# Patient Record
Sex: Female | Born: 1985 | Race: White | Hispanic: No | Marital: Single | Smoking: Current every day smoker
Health system: Southern US, Community
[De-identification: ages and names within clinical notes are randomized; demographics above are authoritative.]

## PROBLEM LIST (undated history)

## (undated) DIAGNOSIS — F32A Depression, unspecified: Secondary | ICD-10-CM

## (undated) DIAGNOSIS — F1010 Alcohol abuse, uncomplicated: Secondary | ICD-10-CM

## (undated) DIAGNOSIS — F329 Major depressive disorder, single episode, unspecified: Secondary | ICD-10-CM

## (undated) DIAGNOSIS — F319 Bipolar disorder, unspecified: Secondary | ICD-10-CM

---
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
## 2005-09-23 ENCOUNTER — Inpatient Hospital Stay (HOSPITAL_COMMUNITY): Admission: RE | Admit: 2005-09-23 | Discharge: 2005-10-01 | Payer: Self-pay | Admitting: Psychiatry

## 2005-09-24 ENCOUNTER — Ambulatory Visit: Payer: Self-pay | Admitting: Psychiatry

## 2006-09-30 ENCOUNTER — Emergency Department (HOSPITAL_COMMUNITY): Admission: EM | Admit: 2006-09-30 | Discharge: 2006-09-30 | Payer: Self-pay | Admitting: Orthopaedic Surgery

## 2011-10-11 ENCOUNTER — Encounter: Payer: Self-pay | Admitting: Physical Medicine and Rehabilitation

## 2011-10-11 ENCOUNTER — Emergency Department (HOSPITAL_COMMUNITY)
Admission: EM | Admit: 2011-10-11 | Discharge: 2011-10-12 | Disposition: A | Discharge status: Home / Self Care | Payer: BC Managed Care – PPO | Attending: Emergency Medicine | Admitting: Emergency Medicine

## 2011-10-11 DIAGNOSIS — W19XXXA Unspecified fall, initial encounter: Secondary | ICD-10-CM | POA: Insufficient documentation

## 2011-10-11 DIAGNOSIS — F1010 Alcohol abuse, uncomplicated: Secondary | ICD-10-CM | POA: Insufficient documentation

## 2011-10-11 DIAGNOSIS — M7989 Other specified soft tissue disorders: Secondary | ICD-10-CM | POA: Insufficient documentation

## 2011-10-11 DIAGNOSIS — F17200 Nicotine dependence, unspecified, uncomplicated: Secondary | ICD-10-CM | POA: Insufficient documentation

## 2011-10-11 DIAGNOSIS — Z79899 Other long term (current) drug therapy: Secondary | ICD-10-CM | POA: Insufficient documentation

## 2011-10-11 DIAGNOSIS — F3130 Bipolar disorder, current episode depressed, mild or moderate severity, unspecified: Secondary | ICD-10-CM | POA: Insufficient documentation

## 2011-10-11 DIAGNOSIS — S6990XA Unspecified injury of unspecified wrist, hand and finger(s), initial encounter: Secondary | ICD-10-CM | POA: Insufficient documentation

## 2011-10-11 HISTORY — DX: Alcohol abuse, uncomplicated: F10.10

## 2011-10-11 HISTORY — DX: Depression, unspecified: F32.A

## 2011-10-11 HISTORY — DX: Bipolar disorder, unspecified: F31.9

## 2011-10-11 HISTORY — DX: Major depressive disorder, single episode, unspecified: F32.9

## 2011-10-11 LAB — RAPID URINE DRUG SCREEN, HOSP PERFORMED
Amphetamines: NOT DETECTED
Cocaine: NOT DETECTED
Opiates: NOT DETECTED
Tetrahydrocannabinol: POSITIVE — AB

## 2011-10-11 LAB — COMPREHENSIVE METABOLIC PANEL
Albumin: 3.4 g/dL — ABNORMAL LOW (ref 3.5–5.2)
CO2: 28 mEq/L (ref 19–32)
Calcium: 9 mg/dL (ref 8.4–10.5)
Chloride: 103 mEq/L (ref 96–112)
Creatinine, Ser: 0.62 mg/dL (ref 0.50–1.10)
GFR calc Af Amer: >90 mL/min (ref >90)
Glucose, Bld: 175 mg/dL — ABNORMAL HIGH (ref 70–99)
Total Bilirubin: 0.2 mg/dL — ABNORMAL LOW (ref 0.3–1.2)
Total Protein: 7 g/dL (ref 6.0–8.3)

## 2011-10-11 LAB — CBC
MCHC: 32.6 g/dL (ref 30.0–36.0)
RBC: 4.19 MIL/uL (ref 3.87–5.11)
WBC: 9.3 10*3/uL (ref 4.0–10.5)

## 2011-10-11 NOTE — ED Notes (Signed)
Pt reports that she had a shot of jack daniels for breakfast.  Reports that she drinks heavily on the weekends.  Seeking help to stop drinking-reports getting into 3 programs before.  Denies being an alcoholic.  Pt also reports that she has been depressed.    Pt also requesting to be evaluated for her (R) hand swelling.   Pt reports falling off the roof this am while hanging christmas lights.  Denies LOC.  Pt is noted to have abrasions on (R) arm and a bruise on (L) leg.  Skin warm, dry and intact.  Neuro intact.

## 2011-10-11 NOTE — ED Notes (Signed)
Pt presents to department for evaluation of R arm pain and injury. States she was putting up christmas lights this morning when she slipped and fell and landed on R hand. Swelling noted to R hand and forearm. Also states she wants help with her depression and drinking problem. States she has been drinking too much. Last drink morning of 12/7. Denies SI/HI. Pt states she wants to get help. 5/10 pain at the time. Ambulatory to triage.

## 2011-10-12 ENCOUNTER — Encounter (HOSPITAL_COMMUNITY): Payer: Self-pay | Admitting: Emergency Medicine

## 2011-10-12 NOTE — ED Provider Notes (Signed)
History     CSN: [id number] Arrival date & time: 10/11/2011  7:51 PM   First MD Initiated Contact with Patient 10/11/11 2317      Chief Complaint  Patient presents with  . Depression  . Alcohol Problem  . Arm Injury    HPI  History provided by the patient. Patient presents with requests for help with alcohol rehabilitation and detox. Patient states that she recently was cited for DUI and has option of participating in alcohol detox program or spending 7 days in jail. Currently patient states she is tired of waiting in the emergency room. Patient denies feeling depressed or having thoughts of suicide or homicidal ideation. Patient's last drink was earlier today. Patient has history of drinking at least a sixpack of beer a day. She denies any other symptoms at this time. Patient denies any other significant past medical history. Patient also reports having some injury to her right hand after a fall. She has had some swelling to the hand but denies pain at this time or decreased range of motion. Patient states she is not worried about her hand.   Past Medical History  Diagnosis Date  . Depression   . Alcohol abuse   . Bipolar 1 disorder     History reviewed. No pertinent past surgical history.  History reviewed. No pertinent family history.  History  Substance Use Topics  . Smoking status: Current Everyday Smoker    Types: Cigarettes  . Smokeless tobacco: Not on file  . Alcohol Use: 3.6 oz/week    6 Cans of beer per week    OB History    Grav Para Term Preterm Abortions TAB SAB Ect Mult Living                  Review of Systems  All other systems reviewed and are negative.    Allergies  Review of patient's allergies indicates no known allergies.  Home Medications   Current Outpatient Rx  Name Route Sig Dispense Refill  . ALPRAZOLAM 0.5 MG PO TABS Oral Take 0.5 mg by mouth 2 (two) times daily.      Marland Kitchen ZIPRASIDONE HCL 80 MG PO CAPS Oral Take 80 mg by mouth 2 (two)  times daily with a meal.        BP 131/79  Pulse 105  Temp(Src) 98 F (36.7 C) (Oral)  Resp 17  SpO2 98%  Physical Exam  Nursing note and vitals reviewed. Constitutional: She is oriented to person, place, and time. She appears well-developed and well-nourished. No distress.  Cardiovascular: Normal rate, regular rhythm and normal heart sounds.   Pulmonary/Chest: Effort normal and breath sounds normal.  Abdominal: Soft. There is no tenderness.  Musculoskeletal:       Diffuse swelling over dorsal rt hand.  Full ROM of rt wirst, fingers and hand.  Normal strength in fingers.  Normal distal sensations and cap refill.  Normal radial pulse.  No obvious deformity.  Neurological: She is alert and oriented to person, place, and time.  Skin: Skin is warm.  Psychiatric: She has a normal mood and affect. Her behavior is normal.    ED Course  Procedures (including critical care time)  Labs Reviewed  URINE RAPID DRUG SCREEN (HOSP PERFORMED) - Abnormal; Notable for the following:    Benzodiazepines POSITIVE (*)    Tetrahydrocannabinol POSITIVE (*)    All other components within normal limits  COMPREHENSIVE METABOLIC PANEL - Abnormal; Notable for the following:    Glucose, Bld  175 (*)    Albumin 3.4 (*)    AST 116 (*)    ALT 240 (*)    Total Bilirubin 0.2 (*)    All other components within normal limits  CBC   Results for orders placed during the hospital encounter of 10/11/11  URINE RAPID DRUG SCREEN (HOSP PERFORMED)      Component Value Range   Opiates NONE DETECTED  NONE DETECTED    Cocaine NONE DETECTED  NONE DETECTED    Benzodiazepines POSITIVE (*) NONE DETECTED    Amphetamines NONE DETECTED  NONE DETECTED    Tetrahydrocannabinol POSITIVE (*) NONE DETECTED    Barbiturates NONE DETECTED  NONE DETECTED   CBC      Component Value Range   WBC 9.3  4.0 - 10.5 (K/uL)   RBC 4.19  3.87 - 5.11 (MIL/uL)   Hemoglobin 12.6  12.0 - 15.0 (g/dL)   HCT 16.1  09.6 - 04.5 (%)   MCV 92.4   78.0 - 100.0 (fL)   MCH 30.1  26.0 - 34.0 (pg)   MCHC 32.6  30.0 - 36.0 (g/dL)   RDW 40.9  81.1 - 91.4 (%)   Platelets 222  150 - 400 (K/uL)  COMPREHENSIVE METABOLIC PANEL      Component Value Range   Sodium 139  135 - 145 (mEq/L)   Potassium 3.7  3.5 - 5.1 (mEq/L)   Chloride 103  96 - 112 (mEq/L)   CO2 28  19 - 32 (mEq/L)   Glucose, Bld 175 (*) 70 - 99 (mg/dL)   BUN 11  6 - 23 (mg/dL)   Creatinine, Ser 7.82  0.50 - 1.10 (mg/dL)   Calcium 9.0  8.4 - 95.6 (mg/dL)   Total Protein 7.0  6.0 - 8.3 (g/dL)   Albumin 3.4 (*) 3.5 - 5.2 (g/dL)   AST 213 (*) 0 - 37 (U/L)   ALT 240 (*) 0 - 35 (U/L)   Alkaline Phosphatase 72  39 - 117 (U/L)   Total Bilirubin 0.2 (*) 0.3 - 1.2 (mg/dL)   GFR calc non Af Amer >90  >90 (mL/min)   GFR calc Af Amer >90  >90 (mL/min)      1. Alcohol abuse     MDM   12:00 AM she seen and evaluated. Patient in no acute distress.  Pt denies SI/HI.  Pt no longer wishes to stay and wait.  Pt states she has some alcohol rehab places in mind that she will call and follow up with.  Pt does not wish to have x-rays of rt hand.      Angus Seller, [place] 10/12/11 2031

## 2011-10-13 NOTE — ED Provider Notes (Signed)
Medical screening examination/treatment/procedure(s) were performed by non-physician practitioner and as supervising physician I was immediately available for consultation/collaboration.   Petra Sargeant, MD 10/13/11 0725 

## 2014-04-18 ENCOUNTER — Emergency Department (HOSPITAL_COMMUNITY): Payer: Medicaid Other

## 2014-04-18 ENCOUNTER — Encounter (HOSPITAL_COMMUNITY): Payer: Self-pay | Admitting: Emergency Medicine

## 2014-04-18 ENCOUNTER — Emergency Department (HOSPITAL_COMMUNITY)
Admission: EM | Admit: 2014-04-18 | Discharge: 2014-04-18 | Disposition: A | Discharge status: Home / Self Care | Payer: Medicaid Other | Attending: Emergency Medicine | Admitting: Emergency Medicine

## 2014-04-18 DIAGNOSIS — S1093XA Contusion of unspecified part of neck, initial encounter: Secondary | ICD-10-CM

## 2014-04-18 DIAGNOSIS — F319 Bipolar disorder, unspecified: Secondary | ICD-10-CM | POA: Insufficient documentation

## 2014-04-18 DIAGNOSIS — Z23 Encounter for immunization: Secondary | ICD-10-CM | POA: Insufficient documentation

## 2014-04-18 DIAGNOSIS — S0003XA Contusion of scalp, initial encounter: Secondary | ICD-10-CM | POA: Insufficient documentation

## 2014-04-18 DIAGNOSIS — F17200 Nicotine dependence, unspecified, uncomplicated: Secondary | ICD-10-CM | POA: Insufficient documentation

## 2014-04-18 DIAGNOSIS — S022XXA Fracture of nasal bones, initial encounter for closed fracture: Secondary | ICD-10-CM | POA: Insufficient documentation

## 2014-04-18 DIAGNOSIS — Z79899 Other long term (current) drug therapy: Secondary | ICD-10-CM | POA: Insufficient documentation

## 2014-04-18 DIAGNOSIS — S0083XA Contusion of other part of head, initial encounter: Secondary | ICD-10-CM | POA: Insufficient documentation

## 2014-04-18 MED ORDER — TETANUS-DIPHTH-ACELL PERTUSSIS 5-2.5-18.5 LF-MCG/0.5 IM SUSP
0.5000 mL | Freq: Once | INTRAMUSCULAR | Status: AC
  Administered 2014-04-18: 0.5 mL via INTRAMUSCULAR
  Filled 2014-04-18: qty 0.5

## 2014-04-18 MED ORDER — OXYCODONE-ACETAMINOPHEN 5-325 MG PO TABS: 1.0000 | ORAL_TABLET | ORAL | Status: AC | PRN

## 2014-04-18 MED ORDER — CEPHALEXIN 500 MG PO CAPS: 500.0000 mg | ORAL_CAPSULE | Freq: Four times a day (QID) | ORAL | Status: AC

## 2014-04-18 MED ORDER — OXYCODONE-ACETAMINOPHEN 5-325 MG PO TABS
2.0000 | ORAL_TABLET | Freq: Once | ORAL | Status: AC
  Administered 2014-04-18: 2 via ORAL
  Filled 2014-04-18: qty 2
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
## 2014-04-18 NOTE — Discharge Instructions (Signed)
Take Keflex as prescribed to prevent infection. Take Percocet as needed for pain control. Followup with your primary care doctor. Return as needed if symptoms worsen.  Contusion A contusion is a deep bruise. Contusions happen when an injury causes bleeding under the skin. Signs of bruising include pain, puffiness (swelling), and discolored skin. The contusion may turn blue, purple, or yellow. HOME CARE   Put ice on the injured area.  Put ice in a plastic bag.  Place a towel between your skin and the bag.  Leave the ice on for 15-20 minutes, 03-04 times a day.  Only take medicine as told by your doctor.  Rest the injured area.  If possible, raise (elevate) the injured area to lessen puffiness. GET HELP RIGHT AWAY IF:   You have more bruising or puffiness.  You have pain that is getting worse.  Your puffiness or pain is not helped by medicine. MAKE SURE YOU:   Understand these instructions.  Will watch your condition.  Will get help right away if you are not doing well or get worse. Document Released: 04/08/2008 Document Revised: 01/13/2012 Document Reviewed: 08/26/2011 [facility] Patient Information 2014 [place], [place]. Nasal Fracture A nasal fracture is a break or crack in the bones of the nose. A minor break usually heals in a month. You often will receive black eyes from a nasal fracture. This is not a cause for concern. The black eyes will go away over 1 to 2 weeks.  DIAGNOSIS  Your caregiver may want to examine you if you are concerned about a fracture of the nose. X-rays of the nose may not show a nasal fracture even when one is present. Sometimes your caregiver must wait 1 to 5 days after the injury to re-check the nose for alignment and to take additional X-rays. Sometimes the caregiver must wait until the swelling has gone down. TREATMENT Minor fractures that have caused no deformity often do not require treatment. More serious fractures where bones are displaced may  require surgery. This will take place after the swelling is gone. Surgery will stabilize and align the fracture. HOME CARE INSTRUCTIONS   Put ice on the injured area.  Put ice in a plastic bag.  Place a towel between your skin and the bag.  Leave the ice on for 15-20 minutes, 03-04 times a day.  Take medications as directed by your caregiver.  Only take over-the-counter or prescription medicines for pain, discomfort, or fever as directed by your caregiver.  If your nose starts bleeding, squeeze the soft parts of the nose against the center wall while you are sitting in an upright position for 10 minutes.  Contact sports should be avoided for at least 3 to 4 weeks or as directed by your caregiver. SEEK MEDICAL CARE IF:  Your pain increases or becomes severe.  You continue to have nosebleeds.  The shape of your nose does not return to normal within 5 days.  You have pus draining from the nose. SEEK IMMEDIATE MEDICAL CARE IF:   You have bleeding from your nose that does not stop after 20 minutes of pinching the nostrils closed and keeping ice on the nose.  You have clear fluid draining from your nose.  You notice a grape-like swelling on the dividing wall between the nostrils (septum). This is a collection of blood (hematoma) that must be drained to help prevent infection.  You have difficulty moving your eyes.  You have recurrent vomiting. Document Released: 10/18/2000 Document Revised: 01/13/2012 Document  Reviewed: 02/04/2011 Exit[employer] Patient Information 2014 [place], [place].
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
## 2014-04-28 NOTE — ED Provider Notes (Signed)
Medical screening examination/treatment/procedure(s) were performed by non-physician practitioner and as supervising physician I was immediately available for consultation/collaboration.   EKG Interpretation None       Olivia Mackielga M Jaylynn Mcaleer, MD 04/28/14 [phone]

## 2015-09-05 DEATH — deceased
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
